# Patient Record
Sex: Female | Born: 1985 | Race: Black or African American | Hispanic: No | Marital: Married | Smoking: Current some day smoker
Health system: Southern US, Community
[De-identification: ages and names within clinical notes are randomized; demographics above are authoritative.]

## PROBLEM LIST (undated history)

## (undated) HISTORY — PX: CHOLECYSTECTOMY: SHX55

---

## 2019-11-04 ENCOUNTER — Other Ambulatory Visit: Payer: Self-pay

## 2019-11-04 ENCOUNTER — Emergency Department
Admission: EM | Admit: 2019-11-04 | Discharge: 2019-11-04 | Disposition: A | Discharge status: Home / Self Care | Payer: BC Managed Care – PPO | Attending: Emergency Medicine | Admitting: Emergency Medicine

## 2019-11-04 DIAGNOSIS — R0602 Shortness of breath: Secondary | ICD-10-CM | POA: Diagnosis not present

## 2019-11-04 DIAGNOSIS — F17210 Nicotine dependence, cigarettes, uncomplicated: Secondary | ICD-10-CM | POA: Diagnosis not present

## 2019-11-04 DIAGNOSIS — F419 Anxiety disorder, unspecified: Secondary | ICD-10-CM | POA: Insufficient documentation

## 2019-11-04 DIAGNOSIS — R030 Elevated blood-pressure reading, without diagnosis of hypertension: Secondary | ICD-10-CM | POA: Diagnosis not present

## 2019-11-04 LAB — CBC WITH DIFFERENTIAL/PLATELET
Abs Immature Granulocytes: 0.08 10*3/uL — ABNORMAL HIGH (ref 0.00–0.07)
Basophils Absolute: 0.1 10*3/uL (ref 0.0–0.1)
Basophils Relative: 0 %
Eosinophils Absolute: 0.6 10*3/uL — ABNORMAL HIGH (ref 0.0–0.5)
Eosinophils Relative: 4 %
HCT: 41.5 % (ref 36.0–46.0)
Hemoglobin: 14.2 g/dL (ref 12.0–15.0)
Immature Granulocytes: 1 %
Lymphocytes Relative: 27 %
Lymphs Abs: 4 10*3/uL (ref 0.7–4.0)
MCH: 31.1 pg (ref 26.0–34.0)
MCHC: 34.2 g/dL (ref 30.0–36.0)
MCV: 91 fL (ref 80.0–100.0)
Monocytes Absolute: 0.5 10*3/uL (ref 0.1–1.0)
Monocytes Relative: 4 %
Neutro Abs: 9.8 10*3/uL — ABNORMAL HIGH (ref 1.7–7.7)
Neutrophils Relative %: 64 %
Platelets: 460 10*3/uL — ABNORMAL HIGH (ref 150–400)
RBC: 4.56 MIL/uL (ref 3.87–5.11)
RDW: 12.4 % (ref 11.5–15.5)
WBC: 15 10*3/uL — ABNORMAL HIGH (ref 4.0–10.5)
nRBC: 0 % (ref 0.0–0.2)

## 2019-11-04 LAB — BASIC METABOLIC PANEL
Anion gap: 7 (ref 5–15)
BUN: 10 mg/dL (ref 6–20)
CO2: 24 mmol/L (ref 22–32)
Calcium: 8.7 mg/dL — ABNORMAL LOW (ref 8.9–10.3)
Chloride: 104 mmol/L (ref 98–111)
Creatinine, Ser: 0.76 mg/dL (ref 0.44–1.00)
GFR calc Af Amer: >60 mL/min (ref >60)
GFR calc non Af Amer: >60 mL/min (ref >60)
Glucose, Bld: 100 mg/dL — ABNORMAL HIGH (ref 70–99)
Potassium: 3.8 mmol/L (ref 3.5–5.1)
Sodium: 135 mmol/L (ref 135–145)

## 2019-11-04 LAB — TSH: TSH: 1.956 u[IU]/mL (ref 0.350–4.500)

## 2019-11-04 LAB — TROPONIN I (HIGH SENSITIVITY): Troponin I (High Sensitivity): 4 ng/L (ref <18)

## 2019-11-04 MED ORDER — HYDROXYZINE HCL 50 MG PO TABS: 50.0000 mg | ORAL_TABLET | Freq: Three times a day (TID) | ORAL | 0 refills | Status: AC | PRN

## 2019-11-04 MED ORDER — ALPRAZOLAM 0.5 MG PO TABS
0.5000 mg | ORAL_TABLET | Freq: Once | ORAL | Status: AC
  Administered 2019-11-04: 22:00:00 0.5 mg via ORAL
  Filled 2019-11-04: qty 1

## 2019-11-04 NOTE — Discharge Instructions (Addendum)
Follow-up at [facility] next week as you have planned, as well as with a primary care doctor within the next month.  Return to the ER for any new or worsening chest discomfort, severe headache, severe anxiety that you feel is interfering with your life, any thoughts of wanting to hurt yourself or others, or any other new or worsening symptoms that concern you.  You may take the hydroxyzine as needed for anxiety.

## 2019-11-04 NOTE — ED Triage Notes (Signed)
Pt to the er for stress and difficulty coping, pt is not sleeping, pt has periods of racing heart and SOB. Pt also reports feeling light headed. Pt reports stress at work or home that is contributing.

## 2019-11-04 NOTE — ED Notes (Signed)
Pt appears teary eyed in hallway, stating she feels anxious this week and is having trouble, concentrating,  eating and drinking. Also states she is having abdominal discomfort and feels like her heart is racing at times. States work and home life is giving her anxiety. Denies hx of anxiety.

## 2019-11-04 NOTE — ED Provider Notes (Signed)
[facility] Emergency Department Provider Note ____________________________________________   First MD Initiated Contact with Patient 11/04/19 1912     (approximate)  I have reviewed the triage vital signs and the nursing notes.   HISTORY  Chief Complaint Anxiety    HPI Gloria Clark is a 33 y.o. female with PMH as noted below who presents with anxiety, worse over the last week in which she attributes to several life stressors.  She states that she gets attacks of anxiety sometimes several times per day that last up to an hour.  She states that during these attacks she feels chest pain and palpitations.  Sometimes she feels lightheaded as well.  She feels like her appetite has decreased and she has trouble concentrating especially at her job.  She denies any significant depression, and has had no SI or HI.  She does not feel like she is a danger to herself or others.  She denies any prior history of anxiety.  She states that she called [facility] and made an appointment for next week.  History reviewed. No pertinent past medical history.  There are no active problems to display for this patient.   Past Surgical History:  Procedure Laterality Date  . CHOLECYSTECTOMY      Prior to Admission medications   Medication Sig Start Date End Date Taking? Authorizing Provider  hydrOXYzine (ATARAX/VISTARIL) 50 MG tablet Take 1 tablet (50 mg total) by mouth 3 (three) times daily as needed. 11/04/19   Dionne Bucy, MD    Allergies Patient has no known allergies.  No family history on file.  Social History Social History   Tobacco Use  . Smoking status: Current Some Day Smoker  Substance Use Topics  . Alcohol use: Yes    Comment: occasionaly  . Drug use: Never    Review of Systems  Constitutional: No fever/chills Eyes: No redness.. ENT: No sore throat. Cardiovascular: Positive for intermittent chest pain. Respiratory: Denies shortness of breath.  Gastrointestinal: No vomiting. Genitourinary: Negative for dysuria.  Musculoskeletal: Negative for back pain. Skin: Negative for rash. Neurological: Positive for headache.   ____________________________________________   PHYSICAL EXAM:  VITAL SIGNS: ED Triage Vitals  Enc Vitals Group     BP 11/04/19 1812 (!) 186/123     Pulse Rate 11/04/19 1812 96     Resp 11/04/19 1812 18     Temp 11/04/19 1812 98.4 F (36.9 C)     Temp Source 11/04/19 1812 Oral     SpO2 11/04/19 1812 100 %     Weight 11/04/19 1815 260 lb (117.9 kg)     Height 11/04/19 1815 5\' 5"  (1.651 m)     Head Circumference --      Peak Flow --      Pain Score 11/04/19 1815 7     Pain Loc --      Pain Edu? --      Excl. in [place]? --     Constitutional: Alert and oriented.  Anxious appearing and somewhat tearful, but in no acute distress. Eyes: Conjunctivae are normal.  Head: Atraumatic. Nose: No congestion/rhinnorhea. Mouth/Throat: Mucous membranes are moist.   Neck: Normal range of motion.  Cardiovascular: Normal rate, regular rhythm. Good peripheral circulation. Respiratory: Normal respiratory effort.  No retractions.  Gastrointestinal: No distention.  Musculoskeletal: Extremities warm and well perfused.  Neurologic:  Normal speech and language. No gross focal neurologic deficits are appreciated.  Skin:  Skin is warm and dry. No rash noted. Psychiatric: Mood and  affect are normal. Speech and behavior are normal.  ____________________________________________   LABS (all labs ordered are listed, but only abnormal results are displayed)  Labs Reviewed  BASIC METABOLIC PANEL - Abnormal; Notable for the following components:      Result Value   Glucose, Bld 100 (*)    Calcium 8.7 (*)    All other components within normal limits  CBC WITH DIFFERENTIAL/PLATELET - Abnormal; Notable for the following components:   WBC 15.0 (*)    Platelets 460 (*)    Neutro Abs 9.8 (*)    Eosinophils Absolute 0.6 (*)    Abs  Immature Granulocytes 0.08 (*)    All other components within normal limits  TSH  TROPONIN I (HIGH SENSITIVITY)   ____________________________________________  EKG  ED ECG REPORT I, Dionne BucySebastian Kensley Lares, the attending physician, personally viewed and interpreted this ECG.  Date: 11/04/2019 EKG Time: 2049 Rate: 86 Rhythm: normal sinus rhythm QRS Axis: normal Intervals: normal ST/T Wave abnormalities: normal Narrative Interpretation: no evidence of acute ischemia  ____________________________________________  RADIOLOGY    ____________________________________________   PROCEDURES  Procedure(s) performed: No  Procedures  Critical Care performed: No ____________________________________________   INITIAL IMPRESSION / ASSESSMENT AND PLAN / ED COURSE  Pertinent labs & imaging results that were available during my care of the patient were reviewed by me and considered in my medical decision making (see chart for details).  33 year old female with PMH as noted above presents for anxiety, increased over the last week, and associated with episodes in which she feels chest discomfort and palpitations.  She states that when these episodes happen is after she has been feeling anxious, and she endorses increased life stressors.  On exam, the patient is well-appearing.  Her vital signs are normal except for hypertension.  The rest of the physical exam is unremarkable.  Her EKG is nonischemic.  Given that the patient is having recurrent episodes of chest discomfort and palpitations and sometimes feels near syncopal, as well as the elevated blood pressure, we will obtain basic labs, troponin, and TSH to rule out organic etiologies.  However overall, the patient's presentation is most consistent with anxiety.  At this time the patient has no symptoms of depression, and no SI or HI.  She has no indication for emergent psychiatry evaluation or inpatient admission.  If the work-up is  unremarkable, I anticipate discharge with plan for psychiatry follow-up as the patient has planned.  She also states she plans to follow-up with a PMD.  ----------------------------------------- 11:12 PM on 11/04/2019 -----------------------------------------  The lab work-up is unremarkable except for elevated WBC count, which is nonspecific and likely stress related.  On reassessment, the patient was still significantly hypertensive especially diastolic.  However, she still felt and appeared fairly anxious.  I gave an alprazolam.  The patient is now feeling much more comfortable.  Repeat blood pressure is coming down.  Therefore I strongly suspect that the blood pressure is elevated due to anxiety, rather than this being primary hypertension.  At this time, the patient is stable for discharge home.  I counseled her on the results of the work-up.  I encouraged her to follow-up with [facility] as planned, as well as with a PMD of her choice.  I have prescribed hydroxyzine for symptomatic control.  I gave the patient very thorough return precautions and she expressed understanding.  ____________________________________________   FINAL CLINICAL IMPRESSION(S) / ED DIAGNOSES  Final diagnoses:  Anxiety  Elevated blood pressure, situational  NEW MEDICATIONS STARTED DURING THIS VISIT:  New Prescriptions   HYDROXYZINE (ATARAX/VISTARIL) 50 MG TABLET    Take 1 tablet (50 mg total) by mouth 3 (three) times daily as needed.     Note:  This document was prepared using Dragon voice recognition software and may include unintentional dictation errors.    Arta Silence, MD 11/04/19 2314

## 2019-11-04 NOTE — ED Notes (Signed)
Patient verbalizes understanding of discharge instructions and knows to follow up for her anxiety

## 2019-11-04 NOTE — ED Provider Notes (Signed)
[facility] Emergency Department Provider Note  ____________________________________________  Time seen: Approximately 6:09 PM  The following is a medical screening exam note. It is intended that the patient await an ER room assignment for detailed exam, diagnosis, and disposition.  I have reviewed the triage vital signs.    HISTORY  Chief Complaint No chief complaint on file.   HPI Gloria Clark is a 33 y.o. female presents for treatment of anxiety related to stress and coping. Insomnia, heart racing, short of breath and light headed. Symptoms started getting worse over the past week with increase in stress at home and work. No relief with Melatonin or Benadryl. No history of anxiety.    No past medical history on file.  There are no active problems to display for this patient.   Prior to Admission medications   Not on File    Allergies Patient has no allergy information on record.  No family history on file.  Social History Social History   Tobacco Use  . Smoking status: Not on file  Substance Use Topics  . Alcohol use: Not on file  . Drug use: Not on file    Review of Systems Constitutional: Negative  for fever. ENT: Negative for sore throat. Respiratory: Positive for shortness of breath. Gastrointestinal: No abdominal pain.  No nausea, no vomiting.  No diarrhea.  Musculoskeletal: Negative for generalized body aches. Skin: Negative for rash/lesion/wound. Neurological: Negative for headaches, focal weakness or numbness. Psychiatric: Positive for anxiety. ____________________________________________   PHYSICAL EXAM:  VITAL SIGNS:  Today's Vitals   11/04/19 1812 11/04/19 1815  BP: (!) 186/123   Pulse: 96   Resp: 18   Temp: 98.4 F (36.9 C)   TempSrc: Oral   SpO2: 100%   Weight:  117.9 kg  Height:  5\' 5"  (1.651 m)  PainSc:  7    Body mass index is 43.27 kg/m.   Constitutional: Alert and oriented. No acute  distress. Head: Atraumatic. Nose: No congestion/rhinnorhea. Mouth/Throat: Mucous membranes are moist. Neck: No stridor.  Cardiovascular: Good peripheral circulation. Respiratory: Normal respiratory effort. Musculoskeletal: No restriction Neurologic:  Normal speech and language. No gross focal neurologic deficits are appreciated. Speech is normal. No gait instability. Skin:  Skin is warm, dry and intact. No rash noted. Psychiatric: Mood and affect are normal. Speech and behavior are normal.   ____________________________________________   LABS (all labs ordered are listed, but only abnormal results are displayed)  Labs Reviewed - No data to display ____________________________________________  EKG  Not indicated. ____________________________________________   INITIAL CLINICAL IMPRESSION(S)   Patient with complaint of anxiety. She denies SI or HI.  She is noted to be hypertensive today without a history of the same. She was advised to await ER room assignment.    Victorino Dike, FNP 11/04/19 Reinaldo Berber, MD 11/04/19 2118

## 2019-11-09 ENCOUNTER — Encounter: Payer: Self-pay | Admitting: Emergency Medicine

## 2019-11-09 ENCOUNTER — Other Ambulatory Visit: Payer: Self-pay

## 2019-11-09 ENCOUNTER — Emergency Department: Payer: BC Managed Care – PPO

## 2019-11-09 ENCOUNTER — Emergency Department
Admission: EM | Admit: 2019-11-09 | Discharge: 2019-11-09 | Disposition: A | Discharge status: Home / Self Care | Payer: BC Managed Care – PPO | Attending: Emergency Medicine | Admitting: Emergency Medicine

## 2019-11-09 DIAGNOSIS — F17210 Nicotine dependence, cigarettes, uncomplicated: Secondary | ICD-10-CM | POA: Insufficient documentation

## 2019-11-09 DIAGNOSIS — F410 Panic disorder [episodic paroxysmal anxiety] without agoraphobia: Secondary | ICD-10-CM | POA: Diagnosis not present

## 2019-11-09 DIAGNOSIS — Z79899 Other long term (current) drug therapy: Secondary | ICD-10-CM | POA: Diagnosis not present

## 2019-11-09 DIAGNOSIS — R079 Chest pain, unspecified: Secondary | ICD-10-CM | POA: Diagnosis present

## 2019-11-09 DIAGNOSIS — R030 Elevated blood-pressure reading, without diagnosis of hypertension: Secondary | ICD-10-CM | POA: Diagnosis not present

## 2019-11-09 LAB — CBC
HCT: 41.5 % (ref 36.0–46.0)
Hemoglobin: 13.5 g/dL (ref 12.0–15.0)
MCH: 31 pg (ref 26.0–34.0)
MCHC: 32.5 g/dL (ref 30.0–36.0)
MCV: 95.4 fL (ref 80.0–100.0)
Platelets: 466 10*3/uL — ABNORMAL HIGH (ref 150–400)
RBC: 4.35 MIL/uL (ref 3.87–5.11)
RDW: 12.4 % (ref 11.5–15.5)
WBC: 13.4 10*3/uL — ABNORMAL HIGH (ref 4.0–10.5)
nRBC: 0 % (ref 0.0–0.2)

## 2019-11-09 LAB — TROPONIN I (HIGH SENSITIVITY)
Troponin I (High Sensitivity): 4 ng/L (ref <18)
Troponin I (High Sensitivity): 4 ng/L (ref <18)

## 2019-11-09 LAB — BASIC METABOLIC PANEL
Anion gap: 9 (ref 5–15)
BUN: 11 mg/dL (ref 6–20)
CO2: 26 mmol/L (ref 22–32)
Calcium: 8.8 mg/dL — ABNORMAL LOW (ref 8.9–10.3)
Chloride: 102 mmol/L (ref 98–111)
Creatinine, Ser: 0.78 mg/dL (ref 0.44–1.00)
GFR calc Af Amer: >60 mL/min (ref >60)
GFR calc non Af Amer: >60 mL/min (ref >60)
Glucose, Bld: 95 mg/dL (ref 70–99)
Potassium: 3.3 mmol/L — ABNORMAL LOW (ref 3.5–5.1)
Sodium: 137 mmol/L (ref 135–145)

## 2019-11-09 MED ORDER — HYDROCHLOROTHIAZIDE 12.5 MG PO TABS: 12.5000 mg | ORAL_TABLET | Freq: Every day | ORAL | 6 refills | Status: AC

## 2019-11-09 NOTE — Discharge Instructions (Signed)
Obtain a blood pressure cuff from [facility] or the pharmacy.  Randomly check your blood pressure throughout the day.  This includes right after waking, before and after meals, when you feel stressed, when you feel happy and relaxed.  Keep a note of blood pressure readings over the next 10 to 14 days.  Record a slight note beside each reading to determine when it was taken.  If you consistently find your blood pressure reads over 145 on the top number or over 100 on the bottom number, start the blood pressure medication.  If you find that your top number typically resides below 145, you do not need the blood pressure medication as this is likely attributed to your anxiety.

## 2019-11-09 NOTE — ED Notes (Signed)
Pt inquiring as to wait time. RN apologetic to pt. Denies worsening symptoms. VS rechecked.

## 2019-11-09 NOTE — ED Triage Notes (Signed)
Pt reports CP to her left upper chest with radiation into her left neck intermittently sharp in nature. Pt reports some SOB with it.

## 2019-11-09 NOTE — ED Provider Notes (Signed)
[facility] Emergency Department Provider Note  ____________________________________________  Time seen: Approximately 6:53 PM  I have reviewed the triage vital signs and the nursing notes.   HISTORY  Chief Complaint Chest Pain and Shortness of Breath    HPI Gloria Clark is a 33 y.o. female who presents the emergency department complaining of right-sided chest pain extending into the neck.  Patient states that over the past week she has had increased anxiety/panic type symptoms including chest pain.  Patient states that she has no history of anxiety or panic attacks in the past.  She states that combined stressors at work, family life as well as pandemic has created significant amounts of stress for her this year.  Patient was seen in this department 1 week ago for similar symptoms, diagnosed with anxiety.  Patient states that she filled a prescription for Vistaril but states that the medication makes her so sedated she is unable to use the medication during the day to alleviate any anxiety/panic.  Patient states because of this she is continue to experience symptoms.  Patient has an appointment in 6 days for [facility].  Patient denies any fevers or chills, nasal congestion, sore throat, coughing.  No abdominal pain, nausea vomiting, diarrhea or constipation.  No cardiac history.  Of note patient is concerned about her blood pressure as she states that she has no history of hypertension but her last 2 visits have had blood pressure readings of systolic 180s over diastolic 100s.  Patient does not take any medication for hypertension.         History reviewed. No pertinent past medical history.  There are no active problems to display for this patient.   Past Surgical History:  Procedure Laterality Date  . CHOLECYSTECTOMY      Prior to Admission medications   Medication Sig Start Date End Date Taking? Authorizing Provider  hydrochlorothiazide (HYDRODIURIL) 12.5 MG  tablet Take 1 tablet (12.5 mg total) by mouth daily. 11/09/19   Cuthriell, Delorise RoyalsJonathan D, PA-C  hydrOXYzine (ATARAX/VISTARIL) 50 MG tablet Take 1 tablet (50 mg total) by mouth 3 (three) times daily as needed. 11/04/19   Dionne BucySiadecki, Sebastian, MD    Allergies Patient has no known allergies.  No family history on file.  Social History Social History   Tobacco Use  . Smoking status: Current Some Day Smoker  Substance Use Topics  . Alcohol use: Yes    Comment: occasionaly  . Drug use: Never     Review of Systems  Constitutional: No fever/chills Eyes: No visual changes. No discharge ENT: No upper respiratory complaints. Cardiovascular: Right-sided chest pain. Respiratory: no cough. No SOB. Gastrointestinal: No abdominal pain.  No nausea, no vomiting.  No diarrhea.  No constipation. Musculoskeletal: Negative for musculoskeletal pain. Skin: Negative for rash, abrasions, lacerations, ecchymosis. Neurological: Negative for headaches, focal weakness or numbness. Psychological: Increased stress/anxiety.  Panic attacks.  No suicidal homicidal ideation. 10-point ROS otherwise negative.  ____________________________________________   PHYSICAL EXAM:  VITAL SIGNS: ED Triage Vitals  Enc Vitals Group     BP 11/09/19 1454 (!) 162/104     Pulse Rate 11/09/19 1454 97     Resp 11/09/19 1454 18     Temp 11/09/19 1454 98.3 F (36.8 C)     Temp Source 11/09/19 1454 Oral     SpO2 11/09/19 1454 100 %     Weight 11/09/19 1453 260 lb (117.9 kg)     Height 11/09/19 1453 5\' 5"  (1.651 m)  Head Circumference --      Peak Flow --      Pain Score 11/09/19 1457 5     Pain Loc --      Pain Edu? --      Excl. in [place]? --      Constitutional: Alert and oriented. Well appearing and in no acute distress. Eyes: Conjunctivae are normal. PERRL. EOMI. Head: Atraumatic. ENT:      Ears:       Nose: No congestion/rhinnorhea.      Mouth/Throat: Mucous membranes are moist.  Neck: No stridor.     Cardiovascular: Normal rate, regular rhythm. Normal S1 and S2.  No murmurs, rubs, gallops.  Good peripheral circulation. Respiratory: Normal respiratory effort without tachypnea or retractions. Lungs CTAB. Good air entry to the bases with no decreased or absent breath sounds. Musculoskeletal: Full range of motion to all extremities. No gross deformities appreciated. Neurologic:  Normal speech and language. No gross focal neurologic deficits are appreciated.  Skin:  Skin is warm, dry and intact. No rash noted. Psychiatric: Mood and affect are normal. Speech and behavior are normal. Patient exhibits appropriate insight and judgement.   ____________________________________________   LABS (all labs ordered are listed, but only abnormal results are displayed)  Labs Reviewed  BASIC METABOLIC PANEL - Abnormal; Notable for the following components:      Result Value   Potassium 3.3 (*)    Calcium 8.8 (*)    All other components within normal limits  CBC - Abnormal; Notable for the following components:   WBC 13.4 (*)    Platelets 466 (*)    All other components within normal limits  TROPONIN I (HIGH SENSITIVITY)  TROPONIN I (HIGH SENSITIVITY)   ____________________________________________  EKG   ____________________________________________  RADIOLOGY I personally viewed and evaluated these images as part of my medical decision making, as well as reviewing the written report by the radiologist.  Dg Chest 2 View  Result Date: 11/09/2019 CLINICAL DATA:  Chest pain EXAM: CHEST - 2 VIEW COMPARISON:  None. FINDINGS: The heart size and mediastinal contours are within normal limits. Both lungs are clear. No pleural effusion or pneumothorax. The visualized skeletal structures are unremarkable. Cholecystectomy clips. IMPRESSION: No acute process in the chest. Electronically Signed   By: Macy Mis M.D.   On: 11/09/2019 15:29     ____________________________________________    PROCEDURES  Procedure(s) performed:    Procedures    Medications - No data to display   ____________________________________________   INITIAL IMPRESSION / ASSESSMENT AND PLAN / ED COURSE  Pertinent labs & imaging results that were available during my care of the patient were reviewed by me and considered in my medical decision making (see chart for details).  Review of the [place] CSRS was performed in accordance of the [place] prior to dispensing any controlled drugs.           Patient's diagnosis is consistent with anxiety attack, nonspecific chest pain, elevated blood pressure reading.  Patient presented to emergency department concern for right-sided chest pain.  Patient had been seen with similar complaint approximately a week ago.  Patient states that the as needed medicine hydroxyzine made her very drowsy and groggy and she cannot take it during the day while at work.  Patient has been under significant amounts of stress at work and feels that symptoms are likely attributed to her stress.  She has an appointment to see psychiatry in 6 days.  With onset of chest pain today  patient returns for evaluation.  Reassuringly EKG, chest x-ray, labs reveal no significant abnormality.  These are consistent with previous work-up a week ago.  Patient is not currently experiencing any chest pain.  I feel the symptoms are related to her anxiety.  At this time she has follow-up with psychiatry.  We have discussed stress management techniques as well as continuing medication usage.  I will provide a work note should patient feel that is necessary to take the medication to control symptoms that she may travel safely home to do so.  At this time no further work-up deemed necessary.  Patient did have an elevated blood pressure reading today as well as last week.  This typically is quite elevated on arrival and improved after patient is reassured.  I  feel that this again is likely stress related, however I have encouraged the patient to obtain a blood pressure cuff, randomly take readings over the next 10 to 14 days.  If patient consistently has elevated readings she will start antihypertensive medication that I will prescribe.  If readings are not consistently high do not start the medication.  Follow-up with primary care and psychiatry as needed..  Patient is given ED precautions to return to the ED for any worsening or new symptoms.     ____________________________________________  FINAL CLINICAL IMPRESSION(S) / ED DIAGNOSES  Final diagnoses:  Anxiety attack  Nonspecific chest pain  Elevated blood pressure reading      NEW MEDICATIONS STARTED DURING THIS VISIT:  ED Discharge Orders         Ordered    hydrochlorothiazide (HYDRODIURIL) 12.5 MG tablet  Daily     11/09/19 2032              This chart was dictated using voice recognition software/Dragon. Despite best efforts to proofread, errors can occur which can change the meaning. Any change was purely unintentional.    Racheal Patches, PA-C 11/09/19 2043    Emily Filbert, MD 11/09/19 2046

## 2021-05-05 ENCOUNTER — Other Ambulatory Visit: Payer: Self-pay

## 2021-05-05 ENCOUNTER — Encounter: Payer: Self-pay | Admitting: Emergency Medicine

## 2021-05-05 ENCOUNTER — Emergency Department: Payer: No Typology Code available for payment source

## 2021-05-05 DIAGNOSIS — Z28310 Unvaccinated for covid-19: Secondary | ICD-10-CM | POA: Insufficient documentation

## 2021-05-05 DIAGNOSIS — J101 Influenza due to other identified influenza virus with other respiratory manifestations: Secondary | ICD-10-CM | POA: Diagnosis not present

## 2021-05-05 DIAGNOSIS — R0981 Nasal congestion: Secondary | ICD-10-CM | POA: Diagnosis present

## 2021-05-05 DIAGNOSIS — F17200 Nicotine dependence, unspecified, uncomplicated: Secondary | ICD-10-CM | POA: Diagnosis not present

## 2021-05-05 LAB — CBC WITH DIFFERENTIAL/PLATELET
Abs Immature Granulocytes: 0.11 10*3/uL — ABNORMAL HIGH (ref 0.00–0.07)
Basophils Absolute: 0 10*3/uL (ref 0.0–0.1)
Basophils Relative: 0 %
Eosinophils Absolute: 0.2 10*3/uL (ref 0.0–0.5)
Eosinophils Relative: 2 %
HCT: 38.6 % (ref 36.0–46.0)
Hemoglobin: 12.8 g/dL (ref 12.0–15.0)
Immature Granulocytes: 1 %
Lymphocytes Relative: 2 %
Lymphs Abs: 0.3 10*3/uL — ABNORMAL LOW (ref 0.7–4.0)
MCH: 31.4 pg (ref 26.0–34.0)
MCHC: 33.2 g/dL (ref 30.0–36.0)
MCV: 94.6 fL (ref 80.0–100.0)
Monocytes Absolute: 0.6 10*3/uL (ref 0.1–1.0)
Monocytes Relative: 5 %
Neutro Abs: 11.6 10*3/uL — ABNORMAL HIGH (ref 1.7–7.7)
Neutrophils Relative %: 90 %
Platelets: 376 10*3/uL (ref 150–400)
RBC: 4.08 MIL/uL (ref 3.87–5.11)
RDW: 12.5 % (ref 11.5–15.5)
WBC: 12.8 10*3/uL — ABNORMAL HIGH (ref 4.0–10.5)
nRBC: 0 % (ref 0.0–0.2)

## 2021-05-05 MED ORDER — ACETAMINOPHEN 325 MG PO TABS
650.0000 mg | ORAL_TABLET | Freq: Once | ORAL | Status: AC
  Administered 2021-05-05: 650 mg via ORAL
  Filled 2021-05-05: qty 2

## 2021-05-05 NOTE — ED Triage Notes (Signed)
Pt reports that she was diagnosised with the flu today. She reports that the fever, sore throat and cough are new today.

## 2021-05-06 ENCOUNTER — Emergency Department
Admission: EM | Admit: 2021-05-06 | Discharge: 2021-05-06 | Disposition: A | Discharge status: Home / Self Care | Payer: No Typology Code available for payment source | Attending: Emergency Medicine | Admitting: Emergency Medicine

## 2021-05-06 DIAGNOSIS — J111 Influenza due to unidentified influenza virus with other respiratory manifestations: Secondary | ICD-10-CM

## 2021-05-06 LAB — BASIC METABOLIC PANEL
Anion gap: 5 (ref 5–15)
BUN: 13 mg/dL (ref 6–20)
CO2: 25 mmol/L (ref 22–32)
Calcium: 9.3 mg/dL (ref 8.9–10.3)
Chloride: 107 mmol/L (ref 98–111)
Creatinine, Ser: 1.04 mg/dL — ABNORMAL HIGH (ref 0.44–1.00)
GFR, Estimated: >60 mL/min (ref >60)
Glucose, Bld: 109 mg/dL — ABNORMAL HIGH (ref 70–99)
Potassium: 4.5 mmol/L (ref 3.5–5.1)
Sodium: 137 mmol/L (ref 135–145)

## 2021-05-06 MED ORDER — ONDANSETRON 4 MG PO TBDP
4.0000 mg | ORAL_TABLET | Freq: Once | ORAL | Status: AC
  Administered 2021-05-06: 4 mg via ORAL
  Filled 2021-05-06: qty 1

## 2021-05-06 MED ORDER — IBUPROFEN 800 MG PO TABS
800.0000 mg | ORAL_TABLET | Freq: Once | ORAL | Status: AC
  Administered 2021-05-06: 800 mg via ORAL
  Filled 2021-05-06: qty 1

## 2021-05-06 MED ORDER — ONDANSETRON 4 MG PO TBDP: 4.0000 mg | ORAL_TABLET | Freq: Three times a day (TID) | ORAL | 0 refills | Status: AC | PRN

## 2021-05-06 NOTE — ED Provider Notes (Signed)
[facility] Emergency Department Provider Note  ____________________________________________   Event Date/Time   First MD Initiated Contact with Patient 05/06/21 0019     (approximate)  I have reviewed the triage vital signs and the nursing notes.   HISTORY  Chief Complaint Influenza    HPI Gloria Clark is a 35 y.o. female with no significant past medical history who presents to the emergency department with complaints of nasal congestion for the past week and a half, cough for 3 days, fever, body aches.  Went to urgent care today and tested positive for influenza A.  It was COVID-negative.  Was given a prescription for Tamiflu which she has not yet started as well as cough syrup, Tessalon Perles and albuterol.  States she had a coughing episode tonight and felt very nauseated.  Her husband recommended she come to the emergency department.  She states she is currently feeling better.  No chest pain or shortness of breath.  No vomiting or diarrhea.  She is not vaccinated for COVID-19 or influenza.       History reviewed. No pertinent past medical history.  There are no problems to display for this patient.   Past Surgical History:  Procedure Laterality Date  . CHOLECYSTECTOMY      Prior to Admission medications   Medication Sig Start Date End Date Taking? Authorizing Provider  ondansetron (ZOFRAN ODT) 4 MG disintegrating tablet Take 1 tablet (4 mg total) by mouth every 8 (eight) hours as needed for nausea or vomiting. 05/06/21  Yes Pamelyn Bancroft, Layla Maw, DO  hydrochlorothiazide (HYDRODIURIL) 12.5 MG tablet Take 1 tablet (12.5 mg total) by mouth daily. 11/09/19   Cuthriell, Delorise Royals, PA-C  hydrOXYzine (ATARAX/VISTARIL) 50 MG tablet Take 1 tablet (50 mg total) by mouth 3 (three) times daily as needed. 11/04/19   Dionne Bucy, MD    Allergies Patient has no known allergies.  No family history on file.  Social History Social History   Tobacco Use   . Smoking status: Current Some Day Smoker  Substance Use Topics  . Alcohol use: Yes    Comment: occasionaly  . Drug use: Never    Review of Systems Constitutional: + fever. Eyes: No visual changes. ENT: No sore throat. Cardiovascular: Denies chest pain. Respiratory: Denies shortness of breath. Gastrointestinal: No nausea, vomiting, diarrhea. Genitourinary: Negative for dysuria. Musculoskeletal: Negative for back pain. Skin: Negative for rash. Neurological: Negative for focal weakness or numbness.  ____________________________________________   PHYSICAL EXAM:  VITAL SIGNS: ED Triage Vitals  Enc Vitals Group     BP 05/05/21 2221 (!) 161/112     Pulse Rate 05/05/21 2221 (!) 115     Resp 05/05/21 2221 18     Temp 05/05/21 2221 (!) 103.2 F (39.6 C)     Temp Source 05/05/21 2221 Oral     SpO2 05/05/21 2221 100 %     Weight 05/05/21 2222 259 lb 14.8 oz (117.9 kg)     Height 05/05/21 2222 5\' 5"  (1.651 m)     Head Circumference --      Peak Flow --      Pain Score 05/05/21 2221 7     Pain Loc --      Pain Edu? --      Excl. in GC? --    CONSTITUTIONAL: Alert and oriented and responds appropriately to questions. Well-appearing; well-nourished HEAD: Normocephalic EYES: Conjunctivae clear, pupils appear equal, EOM appear intact ENT: normal nose; moist mucous membranes NECK: Supple, normal  ROM CARD: RRR; S1 and S2 appreciated; no murmurs, no clicks, no rubs, no gallops RESP: Normal chest excursion without splinting or tachypnea; breath sounds clear and equal bilaterally; no wheezes, no rhonchi, no rales, no hypoxia or respiratory distress, speaking full sentences ABD/GI: Normal bowel sounds; non-distended; soft, non-tender, no rebound, no guarding, no peritoneal signs, no hepatosplenomegaly BACK: The back appears normal EXT: Normal ROM in all joints; no deformity noted, no edema; no cyanosis SKIN: Normal color for age and race; warm; no rash on exposed skin NEURO: Moves  all extremities equally PSYCH: The patient's mood and manner are appropriate.  ____________________________________________   LABS (all labs ordered are listed, but only abnormal results are displayed)  Labs Reviewed  CBC WITH DIFFERENTIAL/PLATELET - Abnormal; Notable for the following components:      Result Value   WBC 12.8 (*)    Neutro Abs 11.6 (*)    Lymphs Abs 0.3 (*)    Abs Immature Granulocytes 0.11 (*)    All other components within normal limits  BASIC METABOLIC PANEL - Abnormal; Notable for the following components:   Glucose, Bld 109 (*)    Creatinine, Ser 1.04 (*)    All other components within normal limits   ____________________________________________  EKG   ____________________________________________  RADIOLOGY I, Kedrick Mcnamee, personally viewed and evaluated these images (plain radiographs) as part of my medical decision making, as well as reviewing the written report by the radiologist.  ED MD interpretation: Chest x-ray clear.  Official radiology report(s): DG Chest 2 View  Result Date: 05/05/2021 CLINICAL DATA:  Fever, cough. EXAM: CHEST - 2 VIEW COMPARISON:  None. FINDINGS: Heart size and mediastinal contours are within normal limits. Lungs are clear. No pleural effusion or pneumothorax is seen. Osseous structures about the chest are unremarkable. IMPRESSION: No active cardiopulmonary disease. No evidence of pneumonia. Electronically Signed   By: Bary Richard M.D.   On: 05/05/2021 23:43    ____________________________________________   PROCEDURES  Procedure(s) performed (including Critical Care):  Procedures   ____________________________________________   INITIAL IMPRESSION / ASSESSMENT AND PLAN / ED COURSE  As part of my medical decision making, I reviewed the following data within the electronic MEDICAL RECORD NUMBER Nursing notes reviewed and incorporated, Labs reviewed , Old chart reviewed, Radiograph reviewed  and Notes from prior ED  visits         Patient here after she was diagnosed with influenza A today.  Was febrile initially but this is improving with antipyretics.  Does have a leukocytosis with left shift.  Does not appear septic, toxic today.  Tolerating p.o.  No hypoxia.  No increased work of breathing.  Chest x-ray is clear.  She states she is feeling better after the cough medications that were prescribed by urgent care.  We discussed risk and benefits of Tamiflu and I suspect that she is outside the window for this to be effective.  I feel she is safe to be discharged.  I do not feel she needs further emergent work-up, antibiotics at this time.  Will provide with work note and prescription for Zofran.  Patient comfortable with this plan.  Discussed return precautions.  At this time, I do not feel there is any life-threatening condition present. I have reviewed, interpreted and discussed all results (EKG, imaging, lab, urine as appropriate) and exam findings with patient/family. I have reviewed nursing notes and appropriate previous records.  I feel the patient is safe to be discharged home without further emergent workup and can  continue workup as an outpatient as needed. Discussed usual and customary return precautions. Patient/family verbalize understanding and are comfortable with this plan.  Outpatient follow-up has been provided as needed. All questions have been answered.  ____________________________________________   FINAL CLINICAL IMPRESSION(S) / ED DIAGNOSES  Final diagnoses:  Influenza     ED Discharge Orders         Ordered    ondansetron (ZOFRAN ODT) 4 MG disintegrating tablet  Every 8 hours PRN        05/06/21 0049          *Please note:  Gloria Clark was evaluated in Emergency Department on 05/06/2021 for the symptoms described in the history of present illness. She was evaluated in the context of the global COVID-19 pandemic, which necessitated consideration that the patient might be at  risk for infection with the SARS-CoV-2 virus that causes COVID-19. Institutional protocols and algorithms that pertain to the evaluation of patients at risk for COVID-19 are in a state of rapid change based on information released by regulatory bodies including the CDC and federal and state organizations. These policies and algorithms were followed during the patient's care in the ED.  Some ED evaluations and interventions may be delayed as a result of limited staffing during and the pandemic.*   Note:  This document was prepared using Dragon voice recognition software and may include unintentional dictation errors.   Ascher Schroepfer, Layla Maw, DO 05/06/21 [phone]

## 2021-05-06 NOTE — Discharge Instructions (Signed)
You may alternate Tylenol 1000 mg every 6 hours as needed for fever and pain and ibuprofen 800 mg every 8 hours as needed for fever and pain. Please rest and drink plenty of fluids. This is a viral illness causing your symptoms. You do not need antibiotics for a virus. You may use over-the-counter nasal saline spray and Afrin nasal saline spray as needed for nasal congestion. Please do not use Afrin for more than 3 days in a row. You may use guaifenesin and dextromethorphan as needed for cough.  You may use lozenges and Chloraseptic spray to help with sore throat.  Warm salt water gargles can also help with sore throat.  You may use over-the-counter Unisom (doxyalamine) or Benadryl (diphenhydramine) to help with sleep.  Please note that some combination medicines such as DayQuil and NyQuil have multiple medications in them.  Please make sure you look at all labels to ensure that you are not taking too much of any one particular medication.  Symptoms from a virus may take 7-14 days to run its course.  We do not test for the flu from the emergency department as it takes hours for this test to come back and it would not change our management. The flu is treated like any other virus with supportive measures as listed above. At this time you are outside the treatment window for Tamiflu or Xofluza. These medications have to be taken within the first 48 hours of symptoms.  Tamiflu has many side effects including nausea, vomiting and diarrhea.   Steps to find a Primary Care Provider (PCP):  Call [phone] or [phone] to access "[place] Find a Doctor Service."  2.  You may also go on the [facility] website at [email or web address]

## 2021-07-30 IMAGING — CR DG CHEST 2V
1 series · 2 of 2 positions shown · non-contrast
Comparison: None.

CLINICAL DATA: Chest pain

EXAM:
CHEST - 2 VIEW

[Series 1: dg chest 2 view · 0.14mm/px · 2 of 2 slices shown]
[im 1/2]
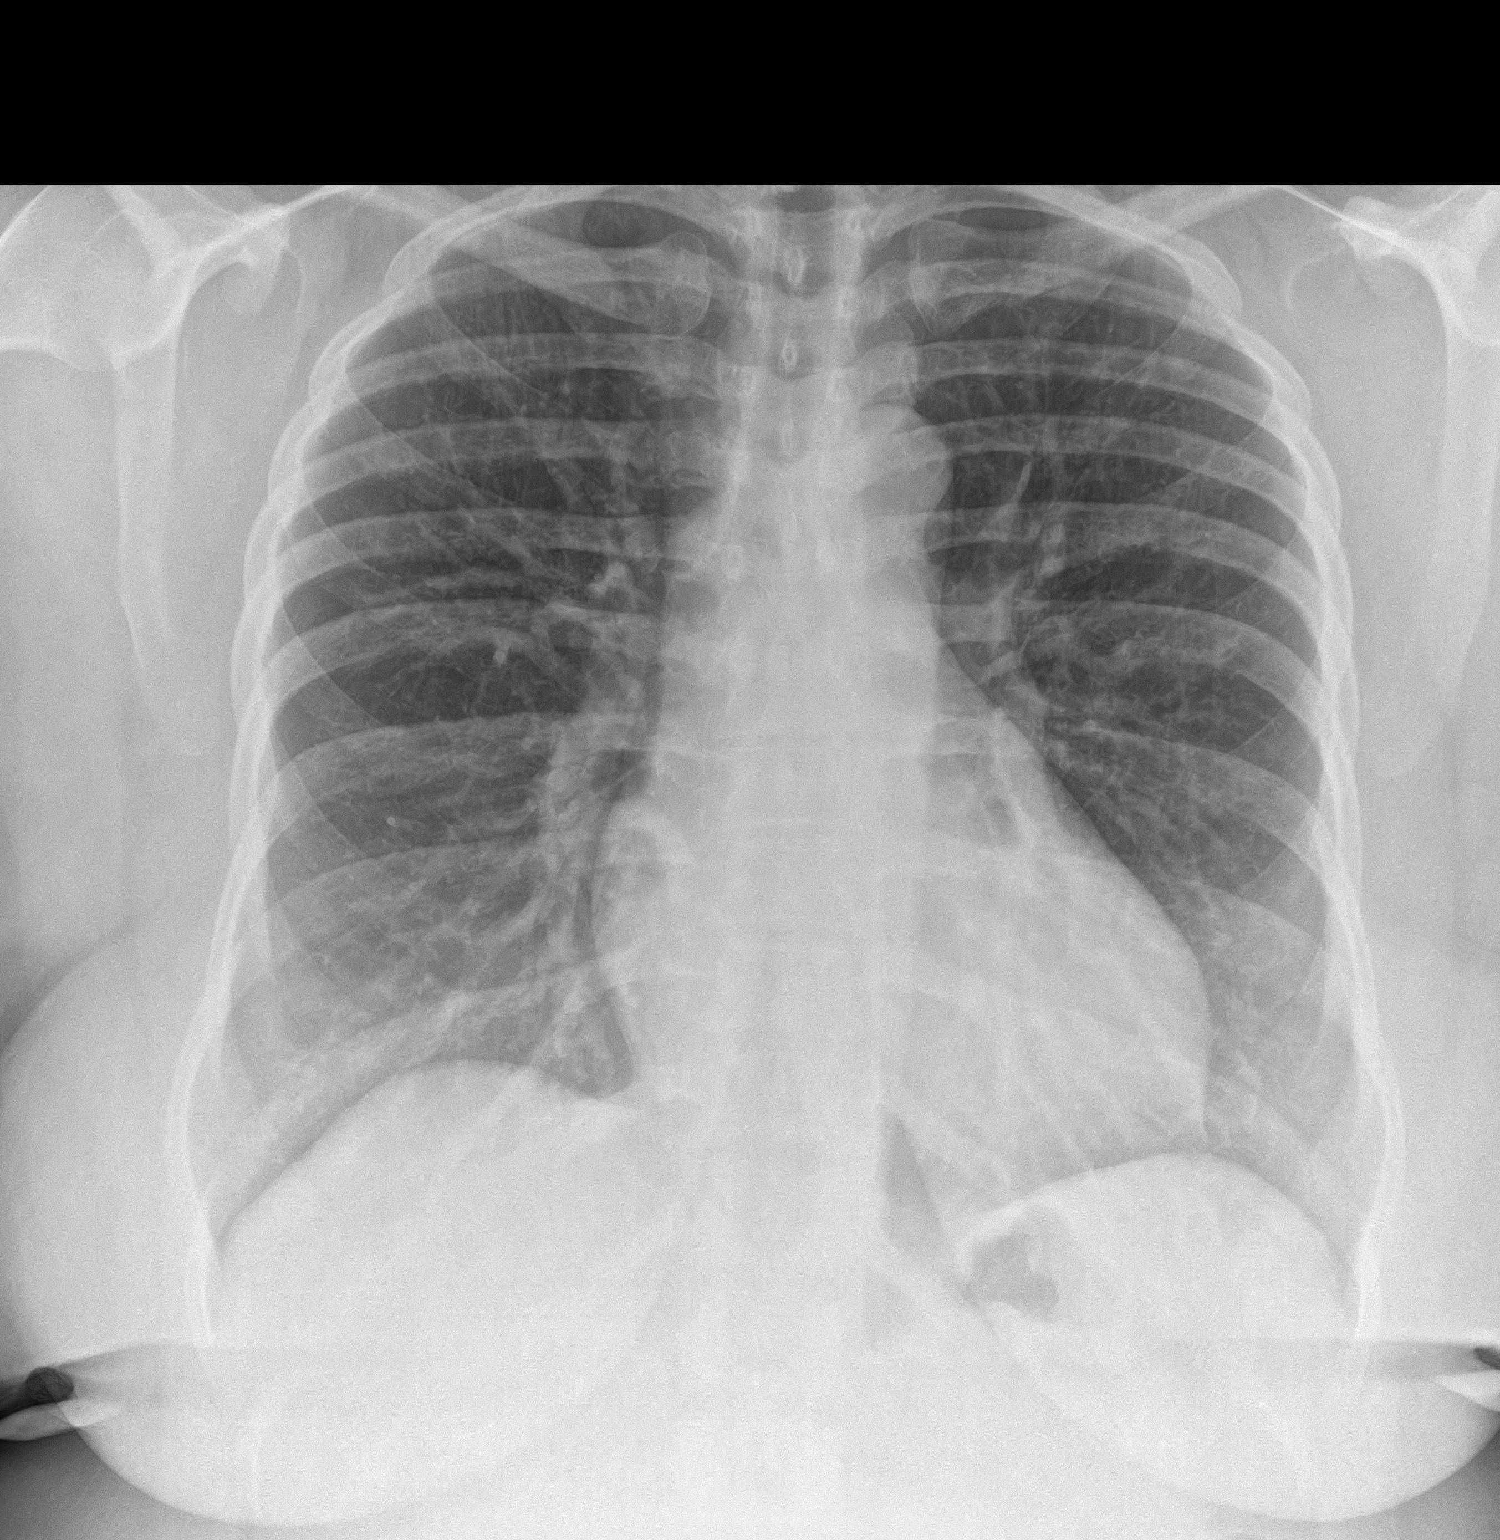
[im 2/2]
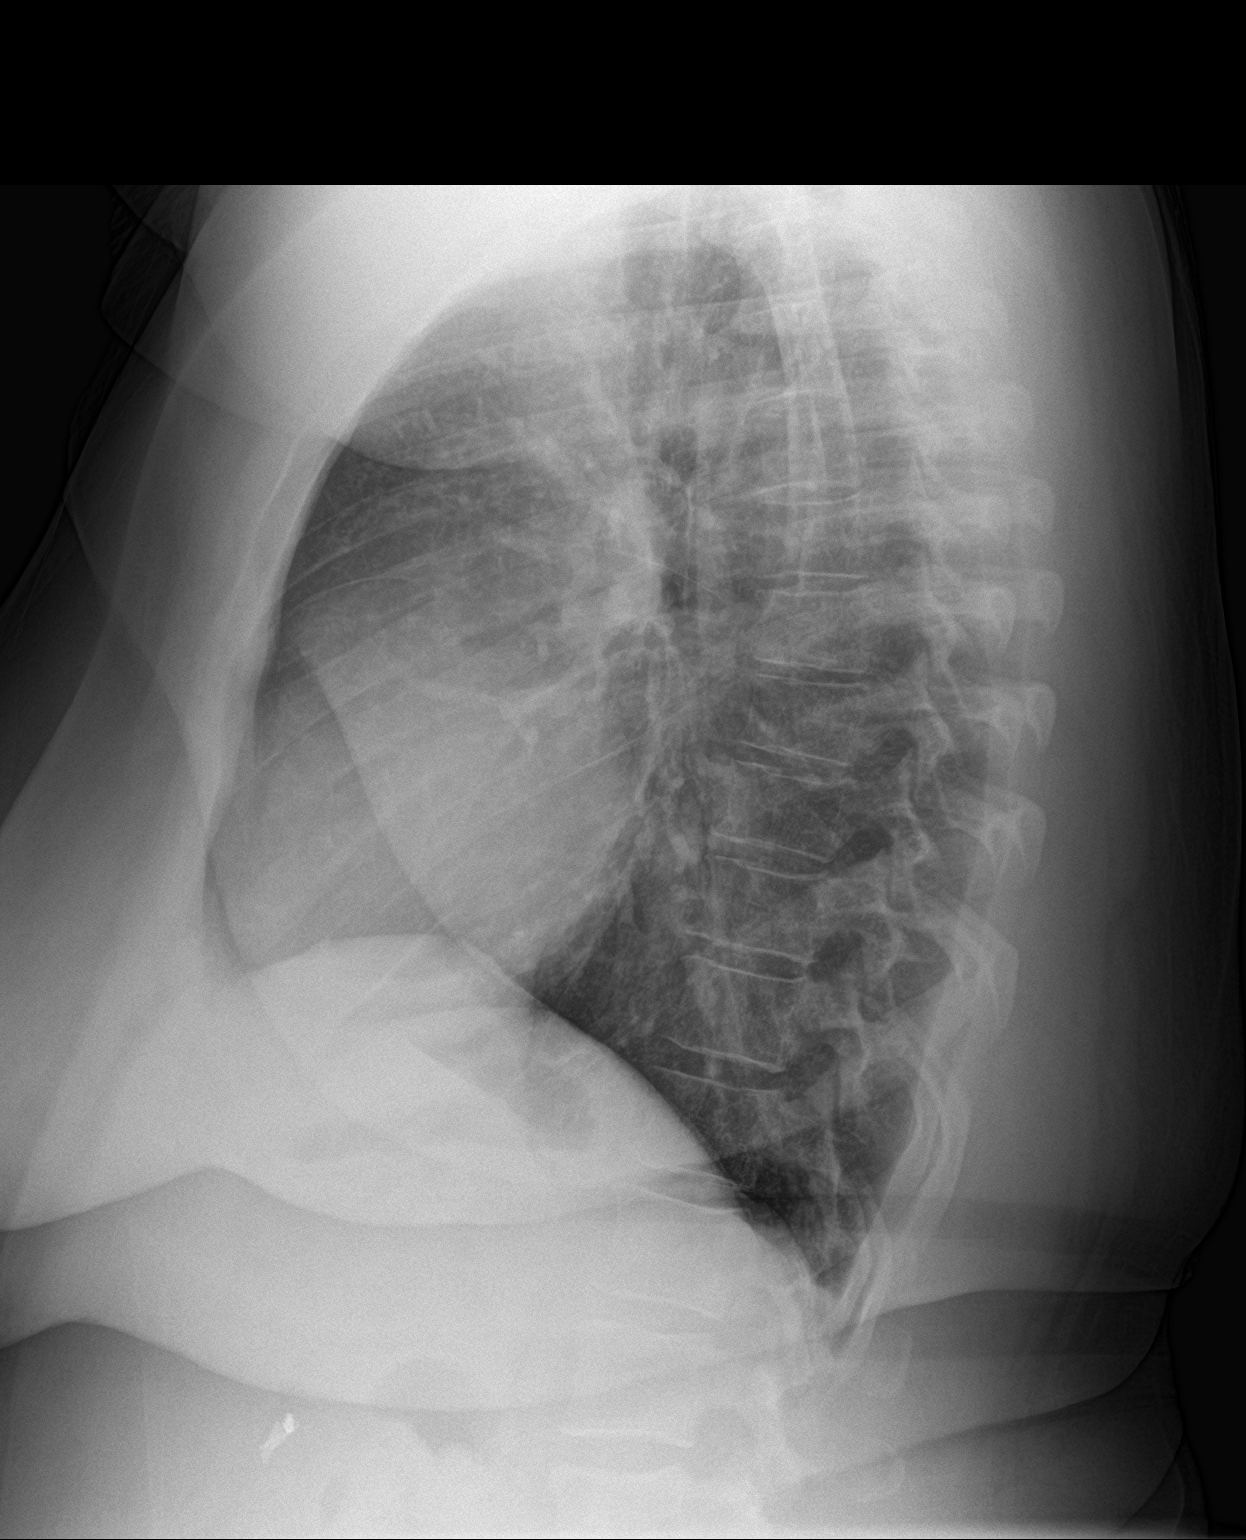

[2 of 2 positions shown; findings below may reference images not displayed]

FINDINGS: The heart size and mediastinal contours are within normal limits.
Both lungs are clear. No pleural effusion or pneumothorax. The
visualized skeletal structures are unremarkable. Cholecystectomy
clips.
IMPRESSION: No acute process in the chest.

## 2023-01-24 IMAGING — CR DG CHEST 2V
1 series · 2 of 2 positions shown · non-contrast
Comparison: None.

CLINICAL DATA: Fever, cough.

EXAM:
CHEST - 2 VIEW

[Series 1: dg chest 2 view · 0.14mm/px · 2 of 2 slices shown]
[im 1/2]
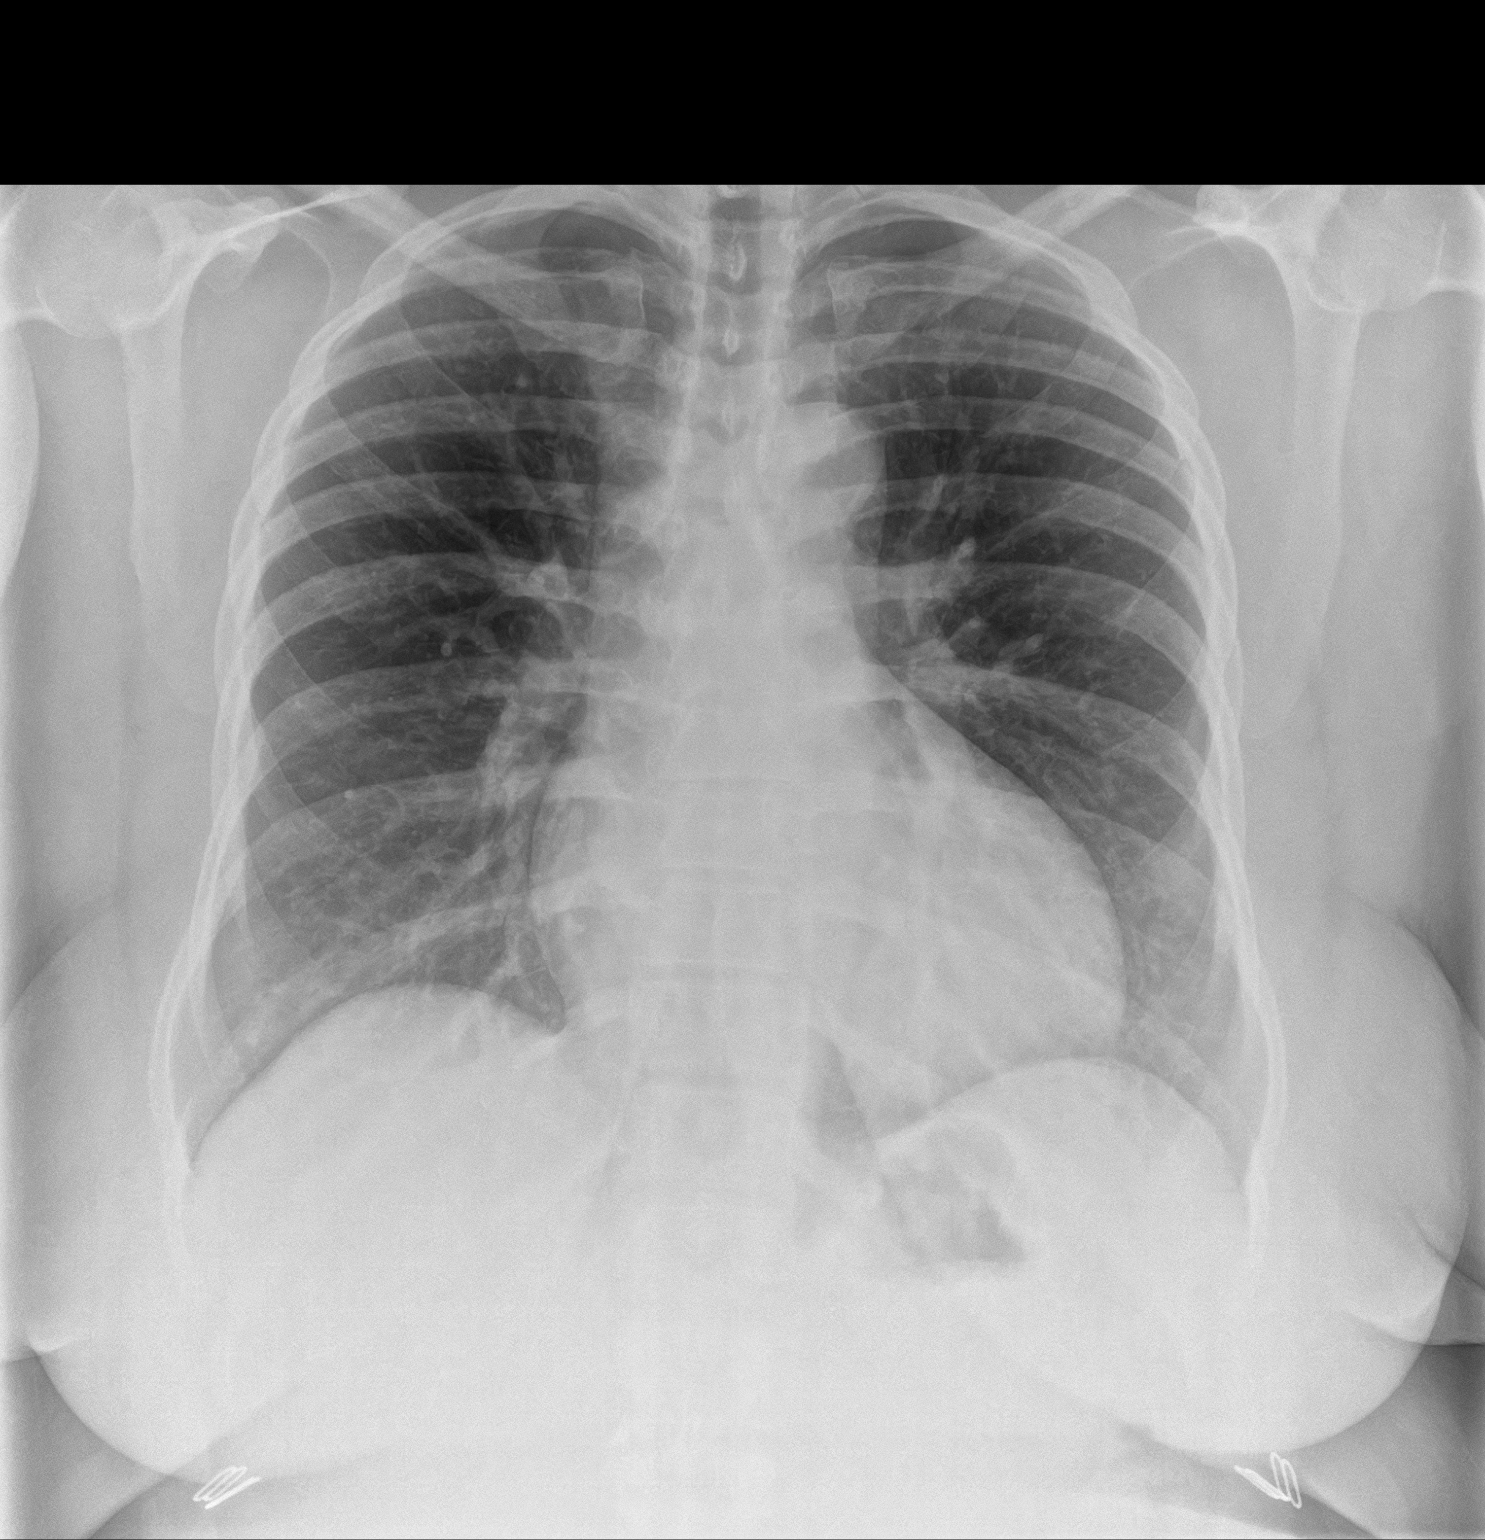
[im 2/2]
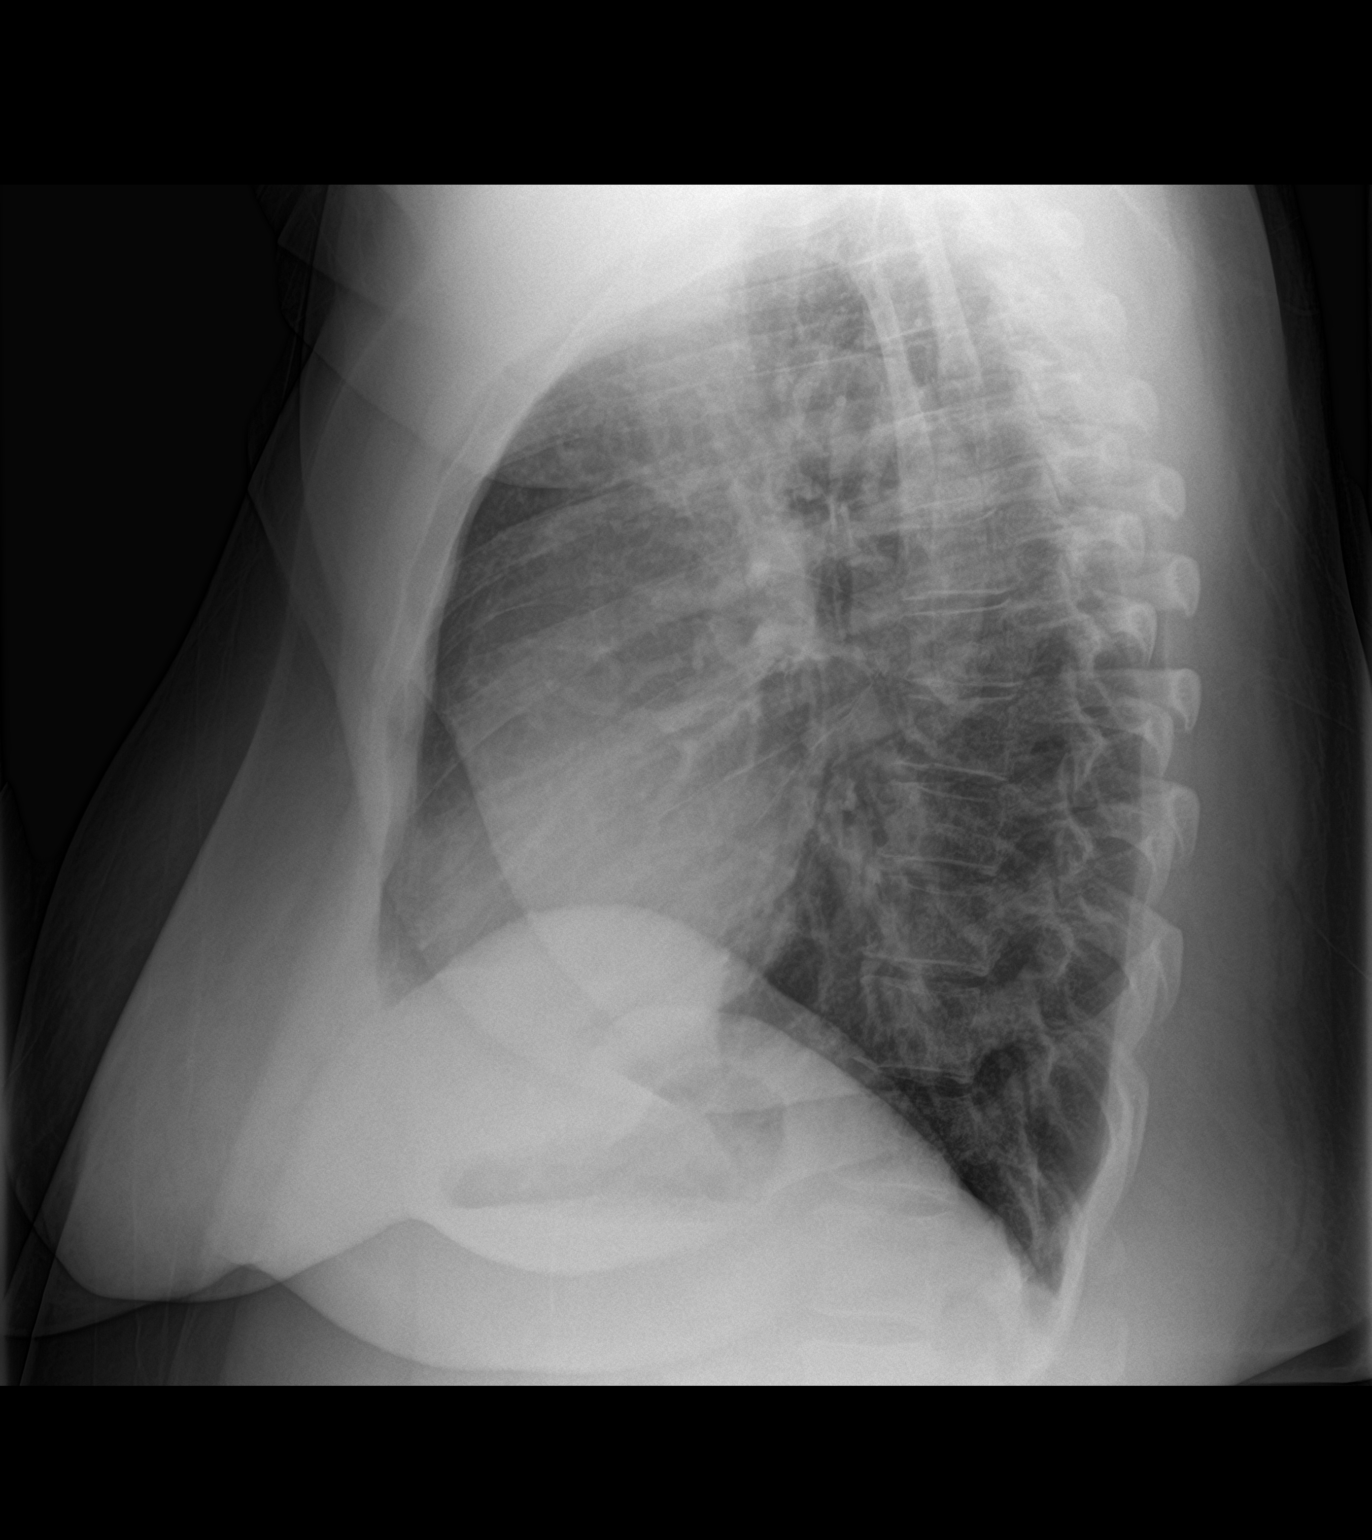

[2 of 2 positions shown; findings below may reference images not displayed]

FINDINGS: Heart size and mediastinal contours are within normal limits. Lungs
are clear. No pleural effusion or pneumothorax is seen. Osseous
structures about the chest are unremarkable.
IMPRESSION: No active cardiopulmonary disease. No evidence of pneumonia.
